# Patient Record
Sex: Female | Born: 1980 | Race: White | Hispanic: No | Marital: Married | Smoking: Never smoker
Health system: Southern US, Community
[De-identification: ages and names within clinical notes are randomized; demographics above are authoritative.]

## PROBLEM LIST (undated history)

## (undated) DIAGNOSIS — O09529 Supervision of elderly multigravida, unspecified trimester: Secondary | ICD-10-CM

## (undated) DIAGNOSIS — N979 Female infertility, unspecified: Secondary | ICD-10-CM

## (undated) DIAGNOSIS — F909 Attention-deficit hyperactivity disorder, unspecified type: Secondary | ICD-10-CM

## (undated) DIAGNOSIS — R011 Cardiac murmur, unspecified: Secondary | ICD-10-CM

## (undated) DIAGNOSIS — Z8742 Personal history of other diseases of the female genital tract: Secondary | ICD-10-CM

## (undated) DIAGNOSIS — E8881 Metabolic syndrome: Secondary | ICD-10-CM

## (undated) DIAGNOSIS — R51 Headache: Secondary | ICD-10-CM

## (undated) DIAGNOSIS — E88819 Insulin resistance, unspecified: Secondary | ICD-10-CM

## (undated) DIAGNOSIS — D649 Anemia, unspecified: Secondary | ICD-10-CM

## (undated) HISTORY — PX: WISDOM TOOTH EXTRACTION: SHX21

## (undated) HISTORY — PX: HYSTEROSCOPY: SHX211

## (undated) HISTORY — DX: Headache: R51

## (undated) HISTORY — PX: LAPAROSCOPY: SHX197

## (undated) HISTORY — DX: Cardiac murmur, unspecified: R01.1

## (undated) HISTORY — PX: OTHER SURGICAL HISTORY: SHX169

## (undated) HISTORY — DX: Personal history of other diseases of the female genital tract: Z87.42

## (undated) HISTORY — DX: Insulin resistance, unspecified: E88.819

## (undated) HISTORY — DX: Anemia, unspecified: D64.9

## (undated) HISTORY — DX: Female infertility, unspecified: N97.9

## (undated) HISTORY — DX: Metabolic syndrome: E88.81

## (undated) HISTORY — PX: BREAST SURGERY: SHX581

## (undated) HISTORY — DX: Supervision of elderly multigravida, unspecified trimester: O09.529

## (undated) HISTORY — PX: ARTHROSCOPIC REPAIR ACL: SUR80

## (undated) HISTORY — DX: Attention-deficit hyperactivity disorder, unspecified type: F90.9

---
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
## 2012-03-12 ENCOUNTER — Other Ambulatory Visit (HOSPITAL_COMMUNITY): Payer: Self-pay | Admitting: Obstetrics and Gynecology

## 2012-03-12 DIAGNOSIS — O3680X0 Pregnancy with inconclusive fetal viability, not applicable or unspecified: Secondary | ICD-10-CM

## 2012-03-14 ENCOUNTER — Ambulatory Visit (HOSPITAL_COMMUNITY)
Admission: RE | Admit: 2012-03-14 | Discharge: 2012-03-14 | Disposition: A | Discharge status: Home / Self Care | Payer: BC Managed Care – PPO | Source: Ambulatory Visit | Attending: Obstetrics and Gynecology | Admitting: Obstetrics and Gynecology

## 2012-03-14 DIAGNOSIS — O209 Hemorrhage in early pregnancy, unspecified: Secondary | ICD-10-CM | POA: Insufficient documentation

## 2012-03-14 DIAGNOSIS — O3680X0 Pregnancy with inconclusive fetal viability, not applicable or unspecified: Secondary | ICD-10-CM

## 2012-04-16 LAB — OB RESULTS CONSOLE HEPATITIS B SURFACE ANTIGEN: Hepatitis B Surface Ag: NEGATIVE

## 2012-04-16 LAB — OB RESULTS CONSOLE ABO/RH: RH Type: POSITIVE

## 2012-07-09 NOTE — L&D Delivery Note (Signed)
Delivery Note At 8:24 AM a viable female was delivered via Vaginal, Spontaneous Delivery (Presentation: Right Occiput Anterior).  APGAR: 5, 9; weight pending.   Placenta status: Intact, Spontaneous.  Cord: 3 vessels with the following complications: None.  Anesthesia: Epidural  Episiotomy: None Lacerations: 2nd degree Suture Repair: 3.0 vicryl rapide Est. Blood Loss (mL): 400  Mom to postpartum.  Baby to stay with mom.  Mikhi Athey D 11/07/2012, 8:49 AM

## 2012-10-22 ENCOUNTER — Inpatient Hospital Stay (HOSPITAL_COMMUNITY): Admission: AD | Admit: 2012-10-22 | Payer: Self-pay | Source: Ambulatory Visit | Admitting: Obstetrics and Gynecology

## 2012-10-29 ENCOUNTER — Telehealth (HOSPITAL_COMMUNITY): Payer: Self-pay | Admitting: *Deleted

## 2012-10-29 ENCOUNTER — Encounter (HOSPITAL_COMMUNITY): Payer: Self-pay | Admitting: *Deleted

## 2012-10-29 NOTE — Telephone Encounter (Signed)
Preadmission screen  

## 2012-11-06 ENCOUNTER — Inpatient Hospital Stay (HOSPITAL_COMMUNITY)
Admission: RE | Admit: 2012-11-06 | Discharge: 2012-11-09 | DRG: 373 | Disposition: A | Discharge status: Home / Self Care | Payer: BC Managed Care – PPO | Source: Ambulatory Visit | Attending: Obstetrics and Gynecology | Admitting: Obstetrics and Gynecology

## 2012-11-06 ENCOUNTER — Encounter (HOSPITAL_COMMUNITY): Payer: Self-pay

## 2012-11-06 DIAGNOSIS — O99892 Other specified diseases and conditions complicating childbirth: Secondary | ICD-10-CM | POA: Diagnosis present

## 2012-11-06 DIAGNOSIS — Z3490 Encounter for supervision of normal pregnancy, unspecified, unspecified trimester: Secondary | ICD-10-CM

## 2012-11-06 DIAGNOSIS — Z22330 Carrier of Group B streptococcus: Secondary | ICD-10-CM

## 2012-11-06 LAB — CBC
Hemoglobin: 12.8 g/dL (ref 12.0–15.0)
MCH: 28.8 pg (ref 26.0–34.0)
MCHC: 33.6 g/dL (ref 30.0–36.0)
RDW: 15.5 % (ref 11.5–15.5)

## 2012-11-06 MED ORDER — BUTORPHANOL TARTRATE 1 MG/ML IJ SOLN: 1.0000 mg | INTRAMUSCULAR | Status: DC | PRN

## 2012-11-06 MED ORDER — LACTATED RINGERS IV SOLN
INTRAVENOUS | Status: DC
  Administered 2012-11-06 – 2012-11-07 (×4): via INTRAVENOUS

## 2012-11-06 MED ORDER — PENICILLIN G POTASSIUM 5000000 UNITS IJ SOLR
5.0000 10*6.[IU] | Freq: Once | INTRAVENOUS | Status: DC
  Filled 2012-11-06: qty 5

## 2012-11-06 MED ORDER — OXYCODONE-ACETAMINOPHEN 5-325 MG PO TABS: 1.0000 | ORAL_TABLET | ORAL | Status: DC | PRN

## 2012-11-06 MED ORDER — MISOPROSTOL 25 MCG QUARTER TABLET
25.0000 ug | ORAL_TABLET | ORAL | Status: DC | PRN
  Administered 2012-11-06 – 2012-11-07 (×2): 25 ug via VAGINAL
  Filled 2012-11-06 (×2): qty 0.25
  Filled 2012-11-06: qty 1

## 2012-11-06 MED ORDER — LIDOCAINE HCL (PF) 1 % IJ SOLN
30.0000 mL | INTRAMUSCULAR | Status: DC | PRN
  Filled 2012-11-06 (×2): qty 30

## 2012-11-06 MED ORDER — OXYTOCIN 40 UNITS IN LACTATED RINGERS INFUSION - SIMPLE MED
62.5000 mL/h | INTRAVENOUS | Status: DC
  Filled 2012-11-06: qty 1000

## 2012-11-06 MED ORDER — TERBUTALINE SULFATE 1 MG/ML IJ SOLN: 0.2500 mg | Freq: Once | INTRAMUSCULAR | Status: AC | PRN

## 2012-11-06 MED ORDER — LACTATED RINGERS IV SOLN: 500.0000 mL | INTRAVENOUS | Status: DC | PRN

## 2012-11-06 MED ORDER — PENICILLIN G POTASSIUM 5000000 UNITS IJ SOLR
2.5000 10*6.[IU] | INTRAMUSCULAR | Status: DC
  Filled 2012-11-06 (×2): qty 2.5

## 2012-11-06 MED ORDER — ACETAMINOPHEN 325 MG PO TABS: 650.0000 mg | ORAL_TABLET | ORAL | Status: DC | PRN

## 2012-11-06 MED ORDER — CITRIC ACID-SODIUM CITRATE 334-500 MG/5ML PO SOLN: 30.0000 mL | ORAL | Status: DC | PRN

## 2012-11-06 MED ORDER — ONDANSETRON HCL 4 MG/2ML IJ SOLN
4.0000 mg | Freq: Four times a day (QID) | INTRAMUSCULAR | Status: DC | PRN
  Administered 2012-11-07: 4 mg via INTRAVENOUS
  Filled 2012-11-06: qty 2

## 2012-11-06 MED ORDER — OXYTOCIN BOLUS FROM INFUSION
500.0000 mL | INTRAVENOUS | Status: DC
  Administered 2012-11-07: 500 mL via INTRAVENOUS

## 2012-11-06 MED ORDER — IBUPROFEN 600 MG PO TABS: 600.0000 mg | ORAL_TABLET | Freq: Four times a day (QID) | ORAL | Status: DC | PRN

## 2012-11-07 ENCOUNTER — Inpatient Hospital Stay (HOSPITAL_COMMUNITY): Payer: BC Managed Care – PPO | Admitting: Anesthesiology

## 2012-11-07 ENCOUNTER — Encounter (HOSPITAL_COMMUNITY): Payer: Self-pay | Admitting: Anesthesiology

## 2012-11-07 ENCOUNTER — Encounter (HOSPITAL_COMMUNITY): Payer: Self-pay

## 2012-11-07 DIAGNOSIS — Z3490 Encounter for supervision of normal pregnancy, unspecified, unspecified trimester: Secondary | ICD-10-CM

## 2012-11-07 MED ORDER — PHENYLEPHRINE 40 MCG/ML (10ML) SYRINGE FOR IV PUSH (FOR BLOOD PRESSURE SUPPORT)
80.0000 ug | PREFILLED_SYRINGE | INTRAVENOUS | Status: DC | PRN
  Filled 2012-11-07: qty 2
  Filled 2012-11-07: qty 5

## 2012-11-07 MED ORDER — MEASLES, MUMPS & RUBELLA VAC ~~LOC~~ INJ
0.5000 mL | INJECTION | Freq: Once | SUBCUTANEOUS | Status: DC
  Filled 2012-11-07: qty 0.5

## 2012-11-07 MED ORDER — LANOLIN HYDROUS EX OINT: TOPICAL_OINTMENT | CUTANEOUS | Status: DC | PRN

## 2012-11-07 MED ORDER — SIMETHICONE 80 MG PO CHEW: 80.0000 mg | CHEWABLE_TABLET | ORAL | Status: DC | PRN

## 2012-11-07 MED ORDER — METHYLERGONOVINE MALEATE 0.2 MG/ML IJ SOLN: 0.2000 mg | INTRAMUSCULAR | Status: DC | PRN

## 2012-11-07 MED ORDER — SENNOSIDES-DOCUSATE SODIUM 8.6-50 MG PO TABS
2.0000 | ORAL_TABLET | Freq: Every day | ORAL | Status: DC
  Administered 2012-11-07: 1 via ORAL

## 2012-11-07 MED ORDER — DIPHENHYDRAMINE HCL 50 MG/ML IJ SOLN: 12.5000 mg | INTRAMUSCULAR | Status: DC | PRN

## 2012-11-07 MED ORDER — BENZOCAINE-MENTHOL 20-0.5 % EX AERO
1.0000 "application " | INHALATION_SPRAY | CUTANEOUS | Status: DC | PRN
  Administered 2012-11-07: 1 via TOPICAL
  Filled 2012-11-07: qty 56

## 2012-11-07 MED ORDER — PENICILLIN G POTASSIUM 5000000 UNITS IJ SOLR
5.0000 10*6.[IU] | Freq: Once | INTRAVENOUS | Status: AC
  Administered 2012-11-07: 5 10*6.[IU] via INTRAVENOUS
  Filled 2012-11-07: qty 5

## 2012-11-07 MED ORDER — ZOLPIDEM TARTRATE 5 MG PO TABS: 5.0000 mg | ORAL_TABLET | Freq: Every evening | ORAL | Status: DC | PRN

## 2012-11-07 MED ORDER — FENTANYL 2.5 MCG/ML BUPIVACAINE 1/10 % EPIDURAL INFUSION (WH - ANES)
14.0000 mL/h | INTRAMUSCULAR | Status: DC | PRN
  Filled 2012-11-07: qty 125

## 2012-11-07 MED ORDER — ONDANSETRON HCL 4 MG/2ML IJ SOLN: 4.0000 mg | INTRAMUSCULAR | Status: DC | PRN

## 2012-11-07 MED ORDER — FENTANYL 2.5 MCG/ML BUPIVACAINE 1/10 % EPIDURAL INFUSION (WH - ANES)
INTRAMUSCULAR | Status: DC | PRN
  Administered 2012-11-07: 14 mL/h via EPIDURAL

## 2012-11-07 MED ORDER — ONDANSETRON HCL 4 MG PO TABS: 4.0000 mg | ORAL_TABLET | ORAL | Status: DC | PRN

## 2012-11-07 MED ORDER — EPHEDRINE 5 MG/ML INJ
10.0000 mg | INTRAVENOUS | Status: DC | PRN
  Filled 2012-11-07: qty 2

## 2012-11-07 MED ORDER — IBUPROFEN 600 MG PO TABS: 600.0000 mg | ORAL_TABLET | Freq: Four times a day (QID) | ORAL | Status: DC

## 2012-11-07 MED ORDER — WITCH HAZEL-GLYCERIN EX PADS
1.0000 "application " | MEDICATED_PAD | CUTANEOUS | Status: DC | PRN
  Administered 2012-11-07: 1 via TOPICAL

## 2012-11-07 MED ORDER — ACETAMINOPHEN 500 MG PO TABS
1000.0000 mg | ORAL_TABLET | Freq: Four times a day (QID) | ORAL | Status: DC | PRN
  Administered 2012-11-07 – 2012-11-09 (×5): 1000 mg via ORAL
  Filled 2012-11-07 (×6): qty 2

## 2012-11-07 MED ORDER — EPHEDRINE 5 MG/ML INJ
10.0000 mg | INTRAVENOUS | Status: DC | PRN
  Filled 2012-11-07: qty 4
  Filled 2012-11-07: qty 2

## 2012-11-07 MED ORDER — MAGNESIUM HYDROXIDE 400 MG/5ML PO SUSP: 30.0000 mL | ORAL | Status: DC | PRN

## 2012-11-07 MED ORDER — OXYCODONE-ACETAMINOPHEN 5-325 MG PO TABS: 1.0000 | ORAL_TABLET | ORAL | Status: DC | PRN

## 2012-11-07 MED ORDER — DIBUCAINE 1 % RE OINT
1.0000 "application " | TOPICAL_OINTMENT | RECTAL | Status: DC | PRN
  Administered 2012-11-07: 1 via RECTAL
  Filled 2012-11-07: qty 28

## 2012-11-07 MED ORDER — PENICILLIN G POTASSIUM 5000000 UNITS IJ SOLR
2.5000 10*6.[IU] | INTRAVENOUS | Status: DC
  Administered 2012-11-07: 2.5 10*6.[IU] via INTRAVENOUS
  Filled 2012-11-07 (×3): qty 2.5

## 2012-11-07 MED ORDER — TETANUS-DIPHTH-ACELL PERTUSSIS 5-2.5-18.5 LF-MCG/0.5 IM SUSP: 0.5000 mL | Freq: Once | INTRAMUSCULAR | Status: DC

## 2012-11-07 MED ORDER — METHYLERGONOVINE MALEATE 0.2 MG PO TABS: 0.2000 mg | ORAL_TABLET | ORAL | Status: DC | PRN

## 2012-11-07 MED ORDER — LACTATED RINGERS IV SOLN
500.0000 mL | Freq: Once | INTRAVENOUS | Status: AC
  Administered 2012-11-07: 1000 mL via INTRAVENOUS

## 2012-11-07 MED ORDER — PHENYLEPHRINE 40 MCG/ML (10ML) SYRINGE FOR IV PUSH (FOR BLOOD PRESSURE SUPPORT)
80.0000 ug | PREFILLED_SYRINGE | INTRAVENOUS | Status: DC | PRN
  Filled 2012-11-07: qty 2

## 2012-11-07 MED ORDER — LIDOCAINE HCL (PF) 1 % IJ SOLN
INTRAMUSCULAR | Status: DC | PRN
  Administered 2012-11-07 (×2): 4 mL

## 2012-11-07 MED ORDER — PRENATAL MULTIVITAMIN CH
1.0000 | ORAL_TABLET | Freq: Every day | ORAL | Status: DC
  Administered 2012-11-08: 1 via ORAL
  Filled 2012-11-07: qty 1

## 2012-11-07 MED ORDER — DIPHENHYDRAMINE HCL 25 MG PO CAPS: 25.0000 mg | ORAL_CAPSULE | Freq: Four times a day (QID) | ORAL | Status: DC | PRN

## 2012-11-07 NOTE — Anesthesia Preprocedure Evaluation (Signed)
Anesthesia Evaluation  Patient identified by MRN, date of birth, ID band Patient awake    Reviewed: Allergy & Precautions, H&P , Patient's Chart, lab work & pertinent test results  Airway Mallampati: III TM Distance: >3 FB Neck ROM: full    Dental no notable dental hx. (+) Teeth Intact   Pulmonary neg pulmonary ROS,  breath sounds clear to auscultation  Pulmonary exam normal       Cardiovascular negative cardio ROS  + Valvular Problems/Murmurs Rhythm:regular Rate:Normal     Neuro/Psych  Headaches, PSYCHIATRIC DISORDERS Anxiety ADHD    GI/Hepatic negative GI ROS, Neg liver ROS,   Endo/Other  negative endocrine ROS  Renal/GU negative Renal ROS  negative genitourinary   Musculoskeletal negative musculoskeletal ROS (+)   Abdominal Normal abdominal exam  (+)   Peds  Hematology  (+) anemia ,   Anesthesia Other Findings   Reproductive/Obstetrics (+) Pregnancy                           Anesthesia Physical Anesthesia Plan  ASA: II  Anesthesia Plan: Epidural   Post-op Pain Management:    Induction:   Airway Management Planned:   Additional Equipment:   Intra-op Plan:   Post-operative Plan:   Informed Consent: I have reviewed the patients History and Physical, chart, labs and discussed the procedure including the risks, benefits and alternatives for the proposed anesthesia with the patient or authorized representative who has indicated his/her understanding and acceptance.     Plan Discussed with: Anesthesiologist  Anesthesia Plan Comments:         Anesthesia Quick Evaluation

## 2012-11-07 NOTE — Anesthesia Procedure Notes (Signed)
Epidural Patient location during procedure: OB Start time: 11/07/2012 2:59 AM  Staffing Anesthesiologist: Amily Depp A. Performed by: anesthesiologist   Preanesthetic Checklist Completed: patient identified, site marked, surgical consent, pre-op evaluation, timeout performed, IV checked, risks and benefits discussed and monitors and equipment checked  Epidural Patient position: sitting Prep: site prepped and draped and DuraPrep Patient monitoring: continuous pulse ox and blood pressure Approach: midline Injection technique: LOR air  Needle:  Needle type: Tuohy  Needle gauge: 17 G Needle length: 9 cm and 9 Needle insertion depth: 5 cm cm Catheter type: closed end flexible Catheter size: 19 Gauge Catheter at skin depth: 10 cm Test dose: negative and Other  Assessment Events: blood not aspirated, injection not painful, no injection resistance, negative IV test and no paresthesia  Additional Notes Patient identified. Risks and benefits discussed including failed block, incomplete  Pain control, post dural puncture headache, nerve damage, paralysis, blood pressure Changes, nausea, vomiting, reactions to medications-both toxic and allergic and post Partum back pain. All questions were answered. Patient expressed understanding and wished to proceed. Sterile technique was used throughout procedure. Epidural site was Dressed with sterile barrier dressing. No paresthesias, signs of intravascular injection Or signs of intrathecal spread were encountered.  Patient was more comfortable after the epidural was dosed. Please see RN's note for documentation of vital signs and FHR which are stable.

## 2012-11-07 NOTE — H&P (Signed)
Marissa Alexander is a 32 y.o. female, G1P0, EGA 40+ weeks with EDC 4-28 presenting for ripening and induction.  Prenatal care essentially uncomplicated, see prenatal records for complete history.  Maternal Medical History:  Fetal activity: Perceived fetal activity is normal.    Prenatal complications: no prenatal complications   OB History   Grav Para Term Preterm Abortions TAB SAB Ect Mult Living   1              Past Medical History  Diagnosis Date  . Heart murmur   . Female infertility   . Insulin resistance   . Headache   . ADHD (attention deficit hyperactivity disorder)   . Anemia   . Hx of endometritis    Past Surgical History  Procedure Laterality Date  . Arthroscopic repair acl    . Athroscopy of knee      right  . Hysteroscopy    . Laparoscopy    . Wisdom tooth extraction    . Breast surgery      lumpectomy R breast benign   Family History: family history includes Cancer in her maternal aunt, maternal uncle, paternal grandfather, and paternal grandmother; DES usage in her paternal grandmother; Diabetes in her paternal grandmother; Hypertension in her mother and paternal grandmother; Miscarriages / [place] in her paternal grandmother; Osteopenia in her mother; Osteoporosis in her paternal grandmother; and Rheum arthritis in her paternal grandmother. Social History:  reports that she has never smoked. She has never used smokeless tobacco. She reports that she does not drink alcohol or use illicit drugs.   Prenatal Transfer Tool  Maternal Diabetes: No Genetic Screening: Normal Maternal Ultrasounds/Referrals: Normal Fetal Ultrasounds or other Referrals:  None Maternal Substance Abuse:  No Significant Maternal Medications:  None Significant Maternal Lab Results:  Lab values include: Group B Strep positive Other Comments:  None  Review of Systems  Respiratory: Negative.   Cardiovascular: Negative.     Dilation: 10 Effacement (%): 100 Station: +1 Exam  by:: AL Rinehart JRN Blood pressure 122/64, pulse 69, temperature 98.8 F (37.1 C), temperature source Axillary, resp. rate 18, height 5\' 6"  (1.676 m), weight 72.576 kg (160 lb), last menstrual period 01/31/2012, SpO2 100.00%. Maternal Exam:  Abdomen: Patient reports no abdominal tenderness. Estimated fetal weight is 7 1/2 lbs.   Fetal presentation: vertex  Introitus: Normal vulva. Normal vagina.  Pelvis: adequate for delivery.   Cervix: Cervix evaluated by digital exam.     Fetal Exam Fetal Monitor Review: Mode: ultrasound.   Baseline rate: 140.  Variability: moderate (6-25 bpm).   Pattern: variable decelerations.    Fetal State Assessment: Category II - tracings are indeterminate.     Physical Exam  Constitutional: She appears well-developed and well-nourished.  Cardiovascular: Normal rate, regular rhythm and normal heart sounds.   No murmur heard. Respiratory: Effort normal and breath sounds normal. No respiratory distress.  GI: Soft.  Gravid     Prenatal labs: ABO, Rh: O/Positive/-- (10/09 0000) Antibody: Negative (10/09 0000) Rubella: Immune (10/09 0000) RPR: NON REACTIVE (05/01 2025)  HBsAg: Negative (10/09 0000)  HIV: Non-reactive (10/09 0000)  GBS: Positive (04/23 0000)  GCT:  111  Assessment/Plan: IUP at 40+ weeks admitted last pm for ripening and induction with unfavorable cervix.  She received cytotec, went into labor, has had SROM and is completely dilated and pushing.  Dr. Ambrose Mantle had ordered her PCN delayed for GBS, but she has now received 2 doses.  Will anticipate SVD.   Marissa Alexander D 11/07/2012,  8:02 AM

## 2012-11-07 NOTE — Anesthesia Postprocedure Evaluation (Signed)
  Anesthesia Post-op Note  Patient: Marissa Alexander  Procedure(s) Performed: * No procedures listed *  Patient Location: Mother/Baby  Anesthesia Type:Epidural  Level of Consciousness: awake  Airway and Oxygen Therapy: Patient Spontanous Breathing  Post-op Pain: mild  Post-op Assessment: Patient's Cardiovascular Status Stable and Respiratory Function Stable  Post-op Vital Signs: stable  Complications: No apparent anesthesia complications

## 2012-11-07 NOTE — Progress Notes (Signed)
Patient ID: Marissa Alexander, female   DOB: [birth year], 32 y.o.   MRN: [id number] I was called with the report that the pt had been found to be fully dilated and there was fetal bradycardia. I came immediately and when I arrived the FHR was normal. The pt received her first dose of penicillin at 3:59 AM Since the FHR is stable I will asllow the pt to labor without pushing and try to get the 4 hours of antibiotics prior to delivery.

## 2012-11-08 NOTE — Progress Notes (Signed)
PPD #1 No problems Afeb, VSS Fundus firm, NT at U-1 Continue routine postpartum care 

## 2012-11-09 NOTE — Progress Notes (Signed)
PPD #2 No problems Afeb, VSS D/c home 

## 2012-11-09 NOTE — Discharge Summary (Signed)
Obstetric Discharge Summary Reason for Admission: induction of labor Prenatal Procedures: none Intrapartum Procedures: spontaneous vaginal delivery Postpartum Procedures: none Complications-Operative and Postpartum: 2nd degree perineal laceration Hemoglobin  Date Value Range Status  11/06/2012 12.8  12.0 - 15.0 g/dL Final     HCT  Date Value Range Status  11/06/2012 38.1  36.0 - 46.0 % Final    Physical Exam:  General: alert Lochia: appropriate Uterine Fundus: firm  Discharge Diagnoses: Term Pregnancy-delivered  Discharge Information: Date: 11/09/2012 Activity: pelvic rest Diet: routine Medications: None Condition: stable Instructions: refer to practice specific booklet Discharge to: home Follow-up Information   Follow up with Brandie Lopes D, MD. Schedule an appointment as soon as possible for a visit in 6 weeks.   Contact information:   [address], [address] [place] [address] [phone]       Newborn Data: Live born female  Birth Weight: 8 lb 5.9 oz (3796 g) APGAR: 5, 9  Home with mother.  Hovanes Hymas D 11/09/2012, 10:05 AM

## 2014-05-10 ENCOUNTER — Encounter (HOSPITAL_COMMUNITY): Payer: Self-pay

## 2017-04-01 LAB — OB RESULTS CONSOLE GC/CHLAMYDIA
CHLAMYDIA, DNA PROBE: NEGATIVE
Gonorrhea: NEGATIVE

## 2017-04-01 LAB — OB RESULTS CONSOLE HIV ANTIBODY (ROUTINE TESTING): HIV: NONREACTIVE

## 2017-04-01 LAB — OB RESULTS CONSOLE RPR: RPR: NONREACTIVE

## 2017-04-01 LAB — OB RESULTS CONSOLE RUBELLA ANTIBODY, IGM: RUBELLA: IMMUNE

## 2017-04-01 LAB — OB RESULTS CONSOLE ANTIBODY SCREEN: ANTIBODY SCREEN: NEGATIVE

## 2017-04-01 LAB — OB RESULTS CONSOLE HEPATITIS B SURFACE ANTIGEN: HEP B S AG: NEGATIVE

## 2017-04-01 LAB — OB RESULTS CONSOLE ABO/RH: RH Type: POSITIVE

## 2017-07-09 NOTE — L&D Delivery Note (Signed)
Delivery Note She progressed to complete and pushed well.  At 3:24 PM a viable female was delivered via Vaginal, Spontaneous (Presentation: vtx; ROA ).  APGAR: 8, 9; weight  pending.   Placenta status: spontaneous, intact.  Cord:  with the following complications: none.   Anesthesia:  Epidural Episiotomy: None Lacerations: 2nd degree;Perineal Suture Repair: 3.0 vicryl rapide Est. Blood Loss (mL): 200  Mom to postpartum.  Baby to Couplet care / Skin to Skin.  Leighton Roachodd D Erhardt Dada 10/28/2017, 3:45 PM

## 2017-09-24 LAB — OB RESULTS CONSOLE GBS: GBS: POSITIVE

## 2017-10-14 ENCOUNTER — Encounter (HOSPITAL_COMMUNITY): Payer: Self-pay | Admitting: *Deleted

## 2017-10-14 ENCOUNTER — Telehealth (HOSPITAL_COMMUNITY): Payer: Self-pay | Admitting: *Deleted

## 2017-10-14 NOTE — Telephone Encounter (Signed)
Preadmission screen  

## 2017-10-24 ENCOUNTER — Inpatient Hospital Stay (HOSPITAL_COMMUNITY): Admission: RE | Admit: 2017-10-24 | Payer: Self-pay | Source: Ambulatory Visit

## 2017-10-28 ENCOUNTER — Encounter (HOSPITAL_COMMUNITY): Payer: Self-pay

## 2017-10-28 ENCOUNTER — Inpatient Hospital Stay (HOSPITAL_COMMUNITY)
Admission: RE | Admit: 2017-10-28 | Discharge: 2017-10-29 | DRG: 807 | Disposition: A | Discharge status: Home / Self Care | Payer: BLUE CROSS/BLUE SHIELD | Source: Ambulatory Visit | Attending: Obstetrics and Gynecology | Admitting: Obstetrics and Gynecology

## 2017-10-28 ENCOUNTER — Inpatient Hospital Stay (HOSPITAL_COMMUNITY): Payer: BLUE CROSS/BLUE SHIELD | Admitting: Anesthesiology

## 2017-10-28 DIAGNOSIS — O99824 Streptococcus B carrier state complicating childbirth: Principal | ICD-10-CM | POA: Diagnosis present

## 2017-10-28 DIAGNOSIS — Z3A39 39 weeks gestation of pregnancy: Secondary | ICD-10-CM | POA: Diagnosis not present

## 2017-10-28 DIAGNOSIS — O9902 Anemia complicating childbirth: Secondary | ICD-10-CM | POA: Diagnosis present

## 2017-10-28 DIAGNOSIS — O26893 Other specified pregnancy related conditions, third trimester: Secondary | ICD-10-CM | POA: Diagnosis present

## 2017-10-28 DIAGNOSIS — D649 Anemia, unspecified: Secondary | ICD-10-CM | POA: Diagnosis present

## 2017-10-28 LAB — CBC
HEMATOCRIT: 34 % — AB (ref 36.0–46.0)
HEMOGLOBIN: 11.1 g/dL — AB (ref 12.0–15.0)
MCH: 26.7 pg (ref 26.0–34.0)
MCHC: 32.6 g/dL (ref 30.0–36.0)
MCV: 81.7 fL (ref 78.0–100.0)
Platelets: 169 10*3/uL (ref 150–400)
RBC: 4.16 MIL/uL (ref 3.87–5.11)
RDW: 14.5 % (ref 11.5–15.5)
WBC: 9.2 10*3/uL (ref 4.0–10.5)

## 2017-10-28 LAB — TYPE AND SCREEN
ABO/RH(D): O POS
ANTIBODY SCREEN: NEGATIVE

## 2017-10-28 LAB — RPR: RPR: NONREACTIVE

## 2017-10-28 MED ORDER — MAGNESIUM HYDROXIDE 400 MG/5ML PO SUSP: 30.0000 mL | ORAL | Status: DC | PRN

## 2017-10-28 MED ORDER — PHENYLEPHRINE 40 MCG/ML (10ML) SYRINGE FOR IV PUSH (FOR BLOOD PRESSURE SUPPORT)
80.0000 ug | PREFILLED_SYRINGE | INTRAVENOUS | Status: DC | PRN
  Filled 2017-10-28: qty 5
  Filled 2017-10-28: qty 10

## 2017-10-28 MED ORDER — PRENATAL MULTIVITAMIN CH
1.0000 | ORAL_TABLET | Freq: Every day | ORAL | Status: DC
  Administered 2017-10-29: 1 via ORAL
  Filled 2017-10-28: qty 1

## 2017-10-28 MED ORDER — ACETAMINOPHEN 325 MG PO TABS: 650.0000 mg | ORAL_TABLET | ORAL | Status: DC | PRN

## 2017-10-28 MED ORDER — LACTATED RINGERS IV SOLN: 500.0000 mL | Freq: Once | INTRAVENOUS | Status: DC

## 2017-10-28 MED ORDER — ZOLPIDEM TARTRATE 5 MG PO TABS: 5.0000 mg | ORAL_TABLET | Freq: Every evening | ORAL | Status: DC | PRN

## 2017-10-28 MED ORDER — IBUPROFEN 600 MG PO TABS: 600.0000 mg | ORAL_TABLET | Freq: Four times a day (QID) | ORAL | Status: DC

## 2017-10-28 MED ORDER — BUTORPHANOL TARTRATE 1 MG/ML IJ SOLN: 1.0000 mg | INTRAMUSCULAR | Status: DC | PRN

## 2017-10-28 MED ORDER — OXYCODONE HCL 5 MG PO TABS: 5.0000 mg | ORAL_TABLET | ORAL | Status: DC | PRN

## 2017-10-28 MED ORDER — OXYCODONE HCL 5 MG PO TABS: 10.0000 mg | ORAL_TABLET | ORAL | Status: DC | PRN

## 2017-10-28 MED ORDER — OXYCODONE-ACETAMINOPHEN 5-325 MG PO TABS: 2.0000 | ORAL_TABLET | ORAL | Status: DC | PRN

## 2017-10-28 MED ORDER — BENZOCAINE-MENTHOL 20-0.5 % EX AERO
1.0000 "application " | INHALATION_SPRAY | CUTANEOUS | Status: DC | PRN
  Administered 2017-10-28: 1 via TOPICAL
  Filled 2017-10-28: qty 56

## 2017-10-28 MED ORDER — EPHEDRINE 5 MG/ML INJ
10.0000 mg | INTRAVENOUS | Status: DC | PRN
  Filled 2017-10-28: qty 2

## 2017-10-28 MED ORDER — LACTATED RINGERS IV SOLN
INTRAVENOUS | Status: DC
  Administered 2017-10-28 (×2): via INTRAVENOUS

## 2017-10-28 MED ORDER — ONDANSETRON HCL 4 MG PO TABS: 4.0000 mg | ORAL_TABLET | ORAL | Status: DC | PRN

## 2017-10-28 MED ORDER — PENICILLIN G POT IN DEXTROSE 60000 UNIT/ML IV SOLN
3.0000 10*6.[IU] | INTRAVENOUS | Status: DC
  Administered 2017-10-28: 3 10*6.[IU] via INTRAVENOUS
  Filled 2017-10-28 (×4): qty 50

## 2017-10-28 MED ORDER — SIMETHICONE 80 MG PO CHEW: 80.0000 mg | CHEWABLE_TABLET | ORAL | Status: DC | PRN

## 2017-10-28 MED ORDER — DIPHENHYDRAMINE HCL 50 MG/ML IJ SOLN: 12.5000 mg | INTRAMUSCULAR | Status: DC | PRN

## 2017-10-28 MED ORDER — SENNOSIDES-DOCUSATE SODIUM 8.6-50 MG PO TABS
2.0000 | ORAL_TABLET | ORAL | Status: DC
  Administered 2017-10-28: 2 via ORAL
  Filled 2017-10-28: qty 2

## 2017-10-28 MED ORDER — WITCH HAZEL-GLYCERIN EX PADS: 1.0000 "application " | MEDICATED_PAD | CUTANEOUS | Status: DC | PRN

## 2017-10-28 MED ORDER — TETANUS-DIPHTH-ACELL PERTUSSIS 5-2.5-18.5 LF-MCG/0.5 IM SUSP: 0.5000 mL | Freq: Once | INTRAMUSCULAR | Status: DC

## 2017-10-28 MED ORDER — PHENYLEPHRINE 40 MCG/ML (10ML) SYRINGE FOR IV PUSH (FOR BLOOD PRESSURE SUPPORT)
80.0000 ug | PREFILLED_SYRINGE | INTRAVENOUS | Status: DC | PRN
  Filled 2017-10-28: qty 5

## 2017-10-28 MED ORDER — OXYTOCIN 40 UNITS IN LACTATED RINGERS INFUSION - SIMPLE MED: 2.5000 [IU]/h | INTRAVENOUS | Status: DC

## 2017-10-28 MED ORDER — COCONUT OIL OIL: 1.0000 "application " | TOPICAL_OIL | Status: DC | PRN

## 2017-10-28 MED ORDER — ONDANSETRON HCL 4 MG/2ML IJ SOLN: 4.0000 mg | INTRAMUSCULAR | Status: DC | PRN

## 2017-10-28 MED ORDER — ONDANSETRON HCL 4 MG/2ML IJ SOLN: 4.0000 mg | Freq: Four times a day (QID) | INTRAMUSCULAR | Status: DC | PRN

## 2017-10-28 MED ORDER — SODIUM CHLORIDE 0.9 % IV SOLN
5.0000 10*6.[IU] | Freq: Once | INTRAVENOUS | Status: AC
  Administered 2017-10-28: 5 10*6.[IU] via INTRAVENOUS
  Filled 2017-10-28: qty 5

## 2017-10-28 MED ORDER — OXYCODONE-ACETAMINOPHEN 5-325 MG PO TABS: 1.0000 | ORAL_TABLET | ORAL | Status: DC | PRN

## 2017-10-28 MED ORDER — SOD CITRATE-CITRIC ACID 500-334 MG/5ML PO SOLN: 30.0000 mL | ORAL | Status: DC | PRN

## 2017-10-28 MED ORDER — OXYTOCIN BOLUS FROM INFUSION: 500.0000 mL | Freq: Once | INTRAVENOUS | Status: DC

## 2017-10-28 MED ORDER — LIDOCAINE HCL (PF) 1 % IJ SOLN
30.0000 mL | INTRAMUSCULAR | Status: DC | PRN
  Filled 2017-10-28: qty 30

## 2017-10-28 MED ORDER — METHYLERGONOVINE MALEATE 0.2 MG/ML IJ SOLN: 0.2000 mg | INTRAMUSCULAR | Status: DC | PRN

## 2017-10-28 MED ORDER — METHYLERGONOVINE MALEATE 0.2 MG PO TABS: 0.2000 mg | ORAL_TABLET | ORAL | Status: DC | PRN

## 2017-10-28 MED ORDER — LIDOCAINE HCL (PF) 1 % IJ SOLN
INTRAMUSCULAR | Status: DC | PRN
  Administered 2017-10-28 (×2): 4 mL via EPIDURAL

## 2017-10-28 MED ORDER — DIPHENHYDRAMINE HCL 25 MG PO CAPS: 25.0000 mg | ORAL_CAPSULE | Freq: Four times a day (QID) | ORAL | Status: DC | PRN

## 2017-10-28 MED ORDER — MEASLES, MUMPS & RUBELLA VAC ~~LOC~~ INJ: 0.5000 mL | INJECTION | Freq: Once | SUBCUTANEOUS | Status: DC

## 2017-10-28 MED ORDER — LACTATED RINGERS IV SOLN: 500.0000 mL | INTRAVENOUS | Status: DC | PRN

## 2017-10-28 MED ORDER — ACETAMINOPHEN 325 MG PO TABS
650.0000 mg | ORAL_TABLET | ORAL | Status: DC | PRN
  Administered 2017-10-28 – 2017-10-29 (×2): 650 mg via ORAL
  Filled 2017-10-28 (×2): qty 2

## 2017-10-28 MED ORDER — FENTANYL 2.5 MCG/ML BUPIVACAINE 1/10 % EPIDURAL INFUSION (WH - ANES)
14.0000 mL/h | INTRAMUSCULAR | Status: DC | PRN
  Administered 2017-10-28: 14 mL/h via EPIDURAL
  Filled 2017-10-28: qty 100

## 2017-10-28 MED ORDER — OXYTOCIN 40 UNITS IN LACTATED RINGERS INFUSION - SIMPLE MED
1.0000 m[IU]/min | INTRAVENOUS | Status: DC
  Administered 2017-10-28: 2 m[IU]/min via INTRAVENOUS
  Filled 2017-10-28: qty 1000

## 2017-10-28 MED ORDER — TERBUTALINE SULFATE 1 MG/ML IJ SOLN
0.2500 mg | Freq: Once | INTRAMUSCULAR | Status: DC | PRN
  Filled 2017-10-28: qty 1

## 2017-10-28 MED ORDER — DIBUCAINE 1 % RE OINT
1.0000 "application " | TOPICAL_OINTMENT | RECTAL | Status: DC | PRN
  Administered 2017-10-28: 1 via RECTAL
  Filled 2017-10-28: qty 28

## 2017-10-28 NOTE — Anesthesia Preprocedure Evaluation (Signed)
Anesthesia Evaluation  Patient identified by MRN, date of birth, ID band Patient awake    Reviewed: Allergy & Precautions, H&P , Patient's Chart, lab work & pertinent test results  Airway Mallampati: II  TM Distance: >3 FB Neck ROM: full    Dental no notable dental hx. (+) Teeth Intact   Pulmonary neg pulmonary ROS,    Pulmonary exam normal breath sounds clear to auscultation       Cardiovascular negative cardio ROS  + Valvular Problems/Murmurs  Rhythm:Regular Rate:Normal     Neuro/Psych  Headaches, PSYCHIATRIC DISORDERS Anxiety Hx/o ADHDADHD    GI/Hepatic negative GI ROS, Neg liver ROS,   Endo/Other  negative endocrine ROS  Renal/GU negative Renal ROS  negative genitourinary   Musculoskeletal negative musculoskeletal ROS (+)   Abdominal   Peds  Hematology  (+) anemia ,   Anesthesia Other Findings   Reproductive/Obstetrics (+) Pregnancy AMA                             Anesthesia Physical  Anesthesia Plan  ASA: II  Anesthesia Plan: Epidural   Post-op Pain Management:    Induction:   PONV Risk Score and Plan:   Airway Management Planned: Natural Airway  Additional Equipment:   Intra-op Plan:   Post-operative Plan:   Informed Consent: I have reviewed the patients History and Physical, chart, labs and discussed the procedure including the risks, benefits and alternatives for the proposed anesthesia with the patient or authorized representative who has indicated his/her understanding and acceptance.     Plan Discussed with: Anesthesiologist  Anesthesia Plan Comments:         Anesthesia Quick Evaluation

## 2017-10-28 NOTE — H&P (Signed)
Marissa Alexander is a 37 y.o. female, G3 P1011, EGA 39+ weeks with [facility] 4-23 presenting for elective induction.  Prenatal care complicated by AMA, u/s with CP cyst and EIF, panorama low risk.  OB History    Gravida  3   Para  1   Term  1   Preterm      AB  1   Living  1     SAB  1   TAB      Ectopic      Multiple      Live Births  1          Past Medical History:  Diagnosis Date  . ADHD (attention deficit hyperactivity disorder)   . AMA (advanced maternal age) multigravida 35+   . Anemia    as a teen  . Female infertility   . Headache(784.0)   . Heart murmur   . Hx of endometritis   . Insulin resistance    Past Surgical History:  Procedure Laterality Date  . ARTHROSCOPIC REPAIR ACL    . athroscopy of knee     right  . BREAST SURGERY     lumpectomy R breast benign  . HYSTEROSCOPY     12 CM endometrioma  . LAPAROSCOPY    . WISDOM TOOTH EXTRACTION     Family History: family history includes Cancer in her maternal aunt, maternal uncle, paternal grandfather, and paternal grandmother; DES usage in her paternal grandmother; Diabetes in her paternal grandmother; Hypertension in her mother and paternal grandmother; Miscarriages / [place] in her paternal grandmother; Osteopenia in her mother; Osteoporosis in her paternal grandmother; Rheum arthritis in her paternal grandmother. Social History:  reports that she has never smoked. She has never used smokeless tobacco. She reports that she does not drink alcohol or use drugs.     Maternal Diabetes: No Genetic Screening: Normal Maternal Ultrasounds/Referrals: Abnormal:  Findings:   Isolated EIF (echogenic intracardiac focus), Isolated choroid plexus cyst Fetal Ultrasounds or other Referrals:  None Maternal Substance Abuse:  No Significant Maternal Medications:  None Significant Maternal Lab Results:  Lab values include: Group B Strep positive Other Comments:  None  Review of Systems  Respiratory:  Negative.   Cardiovascular: Negative.    Maternal Medical History:  Contractions: Perceived severity is mild.    Fetal activity: Perceived fetal activity is normal.    Prenatal complications: no prenatal complications Prenatal Complications - Diabetes: none.    Dilation: 4 Effacement (%): 50 Station: -2 Exam by:: J.Cox, RN Blood pressure 112/79, pulse 69, height 5\' 7"  (1.702 m), weight 82.7 kg (182 lb 4.8 oz), last menstrual period 01/22/2017, unknown if currently breastfeeding. Maternal Exam:  Uterine Assessment: Contraction strength is mild.  Contraction frequency is irregular.   Abdomen: Patient reports no abdominal tenderness. Estimated fetal weight is 7 1/2 lbs.   Fetal presentation: vertex  Introitus: Normal vulva. Normal vagina.  Amniotic fluid character: not assessed.  Pelvis: adequate for delivery.   Cervix: Cervix evaluated by digital exam.     Fetal Exam Fetal Monitor Review: Mode: ultrasound.   Baseline rate: 120-130.  Variability: moderate (6-25 bpm).   Pattern: accelerations present and no decelerations.    Fetal State Assessment: Category I - tracings are normal.     Physical Exam  Vitals reviewed. Constitutional: She appears well-developed and well-nourished.  Cardiovascular: Normal rate and regular rhythm.  Respiratory: Effort normal. No respiratory distress.  GI: Soft.    Prenatal labs: ABO, Rh: O/Positive/-- (09/24 0000) Antibody:  Negative (09/24 0000) Rubella: Immune (09/24 0000) RPR: Nonreactive (09/24 0000)  HBsAg: Negative (09/24 0000)  HIV: Non-reactive (09/24 0000)  GBS: Positive (03/19 0000)   Assessment/Plan: IUP at 39+ weeks with favorable cervix, +GBS, AMA with CP cyst and EIF, Panorama low risk.  Will start pitocin, PCN for +GBS, monitor progress, anticipate SVD.   Leighton Roachodd D Khalise Billard 10/28/2017, 8:33 AM

## 2017-10-28 NOTE — Anesthesia Procedure Notes (Addendum)
Epidural Patient location during procedure: OB Start time: 10/28/2017 9:32 AM  Staffing Anesthesiologist: Mal AmabileFoster, Nikoloz Huy, MD Performed: anesthesiologist   Preanesthetic Checklist Completed: patient identified, site marked, surgical consent, pre-op evaluation, timeout performed, IV checked, risks and benefits discussed and monitors and equipment checked  Epidural Patient position: sitting Prep: site prepped and draped and DuraPrep Patient monitoring: continuous pulse ox and blood pressure Approach: midline Location: L3-L4 Injection technique: LOR air  Needle:  Needle type: Tuohy  Needle gauge: 17 G Needle length: 9 cm and 9 Needle insertion depth: 4 cm Catheter type: closed end flexible Catheter size: 19 Gauge Catheter at skin depth: 9 cm Test dose: negative and Other  Assessment Events: blood not aspirated, injection not painful, no injection resistance, negative IV test and no paresthesia  Additional Notes Patient identified. Risks and benefits discussed including failed block, incomplete  Pain control, post dural puncture headache, nerve damage, paralysis, blood pressure Changes, nausea, vomiting, reactions to medications-both toxic and allergic and post Partum back pain. All questions were answered. Patient expressed understanding and wished to proceed. Sterile technique was used throughout procedure. Epidural site was Dressed with sterile barrier dressing. No paresthesias, signs of intravascular injection Or signs of intrathecal spread were encountered.  Patient was more comfortable after the epidural was dosed. Please see RN's note for documentation of vital signs and FHR which are stable.

## 2017-10-28 NOTE — Anesthesia Pain Management Evaluation Note (Signed)
  CRNA Pain Management Visit Note  Patient: Marissa Alexander, 37 y.o., female  "Hello [facility]. We have an anesthesia team available at all times to provide care throughout the hospital, including epidural management and anesthesia for C-section. I don't know your plan for the delivery whether it a natural birth, water birth, IV sedation, nitrous supplementation, doula or epidural, but we want to meet your pain goals."   1.Was your pain managed to your expectations on prior hospitalizations?   Yes   2.What is your expectation for pain management during this hospitalization?     Epidural  3.How can we help you reach that goal? Epidural in place.  Record the patient's initial score and the patient's pain goal.   Pain: 0  Pain Goal: 4 prior to epidural placement.  [facility] wants you to be able to say your pain was always managed very well.  [name] L 10/28/2017

## 2017-10-28 NOTE — Anesthesia Postprocedure Evaluation (Signed)
Anesthesia Post Note  Patient: Marissa Alexander  Procedure(s) Performed: AN AD HOC LABOR EPIDURAL     Patient location during evaluation: Mother Baby Anesthesia Type: Epidural Level of consciousness: awake Pain management: satisfactory to patient Vital Signs Assessment: post-procedure vital signs reviewed and stable Respiratory status: spontaneous breathing Cardiovascular status: stable Anesthetic complications: no    Last Vitals:  Vitals:   10/28/17 1750 10/28/17 1840  BP: 118/83 113/69  Pulse: 84 84  Temp: 37.2 C 37 C  SpO2: 100% 99%    Last Pain:  Vitals:   10/28/17 1750  TempSrc: Oral   Pain Goal:                 KeyCorpBURGER,Lauria Depoy

## 2017-10-28 NOTE — Progress Notes (Signed)
Comfortable with epidural Afeb, VSS FHT- Cat I, ctx q 3-4 min VE-4-5/70/-1, vtx, AROM clear Continue pitocin and PCN, monitor progress and anticipate SVD

## 2017-10-29 ENCOUNTER — Inpatient Hospital Stay (HOSPITAL_COMMUNITY): Admission: AD | Admit: 2017-10-29 | Payer: Self-pay | Source: Ambulatory Visit | Admitting: Obstetrics and Gynecology

## 2017-10-29 MED ORDER — ACETAMINOPHEN 325 MG PO TABS: 650.0000 mg | ORAL_TABLET | Freq: Four times a day (QID) | ORAL | 1 refills | Status: AC | PRN

## 2017-10-29 NOTE — Lactation Note (Signed)
This note was copied from a baby's chart. Lactation Consultation Note Baby 13 hrs old BF w/o difficulty. Having long feedings. Mom denies painful latches. Mom Bf her now 624 1/38 yr old for 1 yr w/o difficulty.  Mom has everted nipples. Reviewed newborn feeding habits, cluster feeding, I&O, STS. Assess for transfer after feeding.  Answered mom and FOB questions. Encouraged to call for assistance or questions.  [facility] brochure given w/resources, support groups and LC services.  Patient Name: Marissa Luanna ColeJennifer Ozanich ZOXWR'UToday's Date: 10/29/2017 Reason for consult: Initial assessment   Maternal Data Has patient been taught Hand Expression?: Yes Does the patient have breastfeeding experience prior to this delivery?: Yes  Feeding Feeding Type: Breast Fed Length of feed: 30 min  LATCH Score Latch: Grasps breast easily, tongue down, lips flanged, rhythmical sucking.  Audible Swallowing: Spontaneous and intermittent  Type of Nipple: Everted at rest and after stimulation  Comfort (Breast/Nipple): Soft / non-tender  Hold (Positioning): No assistance needed to correctly position infant at breast.  LATCH Score: 10  Interventions Interventions: Breast feeding basics reviewed;Support pillows;Skin to skin;Breast massage  Lactation Tools Discussed/Used WIC Program: No   Consult Status Consult Status: Follow-up Date: 10/30/17 Follow-up type: In-patient    Charyl DancerCARVER, Cristol Engdahl G 10/29/2017, 4:51 AM

## 2017-10-29 NOTE — Progress Notes (Signed)
Patient ID: Marissa HaskellJennifer K Alexander, female   DOB: 09-Nov-1980, 37 y.o.   MRN: [id number] Pt doing well with no complaints. Pain well controlled with tylenol. Denies fever, chills, SOB or CP. Mild HA  Now but improving with breakfast. No blurry vision. Voiding well. Lochia mild. Bonding well with baby - breastfeeding VSS ABD - FF and 2cm below umb EXT - no homans  A/P: PPD#1 s/p svd - stable         Routine pp care         If baby ok for discharge then pt may be discharged too         D/C instructions reviewed; f/u in 6 weeks

## 2017-10-29 NOTE — Discharge Instructions (Signed)
Nothing in vagina for 6 weeks.  No sex, tampons, and douching.  Other instructions as in [facility] Discharge Booklet. °

## 2017-10-29 NOTE — Discharge Summary (Signed)
OB Discharge Summary     Patient Name: Marissa Alexander DOB: 1980/12/12 MRN: [id number]  Date of admission: 10/28/2017 Delivering MD: Jackelyn KnifeMEISINGER, TODD   Date of discharge: 10/29/2017  Admitting diagnosis: INDUCTION Intrauterine pregnancy: 1856w6d     Secondary diagnosis:  Active Problems:   Indication for care in labor or delivery  Additional problems: none     Discharge diagnosis: Term Pregnancy Delivered                                                                                                Post partum procedures:none  Augmentation: AROM and Pitocin  Complications: None  Hospital course:  Induction of Labor With Vaginal Delivery   37 y.o. yo [id number] at 4856w6d was admitted to the hospital 10/28/2017 for induction of labor.  Indication for induction: Favorable cervix at term.  Patient had an uncomplicated labor course as follows: Membrane Rupture Time/Date: 12:27 PM ,10/28/2017   Intrapartum Procedures: Episiotomy: None [1]                                         Lacerations:  2nd degree [3];Perineal [11]  Patient had delivery of a Viable infant.  Information for the patient's newborn:  Penny, Girl Victorino DikeJennifer [[id number]][[id number]]  Delivery Method: Vaginal, Spontaneous(Filed from Delivery Summary)   10/28/2017  Details of delivery can be found in separate delivery note.  Patient had a routine postpartum course. Patient is discharged home 10/29/17.  Physical exam  Vitals:   10/28/17 1750 10/28/17 1840 10/28/17 2240 10/29/17 0606  BP: 118/83 113/69 113/66 121/74  Pulse: 84 84 64 69  Resp:   16 18  Temp: 99 F (37.2 C) 98.6 F (37 C) 98.7 F (37.1 C) 98.5 F (36.9 C)  TempSrc: Oral  Oral Oral  SpO2: 100% 99% 99%   Weight:      Height:       General: alert, cooperative and no distress Lochia: appropriate Uterine Fundus: firm Incision: N/A DVT Evaluation: No evidence of DVT seen on physical exam. No significant calf/ankle edema. Labs: Lab Results  Component Value  Date   WBC 9.2 10/28/2017   HGB 11.1 (L) 10/28/2017   HCT 34.0 (L) 10/28/2017   MCV 81.7 10/28/2017   PLT 169 10/28/2017   No flowsheet data found.  Discharge instruction: per After Visit Summary and "Baby and Me Booklet".  After visit meds:  Allergies as of 10/29/2017      Reactions   Motrin [ibuprofen] Nausea Only   Other    Possible reaction to dissolvable sutures. Use chromic if needed.  Does OK with Vicryl Rapide      Medication List    TAKE these medications   acetaminophen 500 MG tablet Commonly known as:  TYLENOL Take 500 mg by mouth every 6 (six) hours as needed for mild pain or headache. What changed:  Another medication with the same name was added. Make sure you understand how and when to take each.   acetaminophen 325 MG tablet  Commonly known as:  TYLENOL Take 2 tablets (650 mg total) by mouth every 6 (six) hours as needed for moderate pain or headache (for pain scale < 4). What changed:  You were already taking a medication with the same name, and this prescription was added. Make sure you understand how and when to take each.   calcium carbonate 500 MG chewable tablet Commonly known as:  TUMS - dosed in mg elemental calcium Chew 1 tablet by mouth daily as needed for heartburn.   prenatal multivitamin Tabs tablet Take 1 tablet by mouth daily at 12 noon.   ranitidine 150 MG tablet Commonly known as:  ZANTAC Take 150 mg by mouth 2 (two) times daily as needed for heartburn.       Diet: routine diet  Activity: Advance as tolerated. Pelvic rest for 6 weeks.   Outpatient follow up:6 weeks Follow up Appt:No future appointments. Follow up Visit:No follow-ups on file.  Postpartum contraception: Not Discussed  Newborn Data: Live born female  Birth Weight: 8 lb 4.5 oz (3755 g) APGAR: 8, 9  Newborn Delivery   Birth date/time:  10/28/2017 15:24:00 Delivery type:  Vaginal, Spontaneous     Baby Feeding: Breast Disposition:home with  mother   10/29/2017 Cathrine Muster, DO

## 2017-10-29 NOTE — Progress Notes (Signed)
CSW received consult due to score 11 on the [place].   Parents were pleasant and welcoming of CSW's visit.  MOB was quiet, though receptive of talking about her feelings.  She introduced her visitors as her sister and 38 year old daughter and stated that we could talk openly with her family in the room. Parents report that they are exhausted, mainly, but otherwise doing well.  MOB acknowledges an increase in anxiety at the end of her pregnancy, but states that her understanding is that this is normal.  She spoke about what she coped with this and did not let it interfere with daily life.  She denies symptoms of PMADs after her first daughter and reports having great supports.   CSW provided education regarding Baby Blues vs PMADs and provided MOB with information about support groups [facility].  CSW encouraged MOB to evaluate her mental health throughout the postpartum period with the use of the New Mom Checklist developed by [facility] and notify a medical professional if symptoms arise.  MOB agrees and states no questions, concerns, or needs at this time.  FOB and sister appear very supportive.

## 2019-10-08 ENCOUNTER — Ambulatory Visit: Payer: Self-pay | Attending: Internal Medicine

## 2019-10-08 DIAGNOSIS — Z23 Encounter for immunization: Secondary | ICD-10-CM

## 2019-10-08 NOTE — Progress Notes (Signed)
   Covid-19 Vaccination Clinic  Name:  Marissa Alexander    MRN: [id number] DOB: 05-05-1981  10/08/2019  [name] was observed post Covid-19 immunization for 15 minutes without incident. She was provided with Vaccine Information Sheet and instruction to access the V-Safe system.   [name] was instructed to call 911 with any severe reactions post vaccine: Marland Kitchen Difficulty breathing  . Swelling of face and throat  . A fast heartbeat  . A bad rash all over body  . Dizziness and weakness   Immunizations Administered    Name Date Dose VIS Date Route   Pfizer COVID-19 Vaccine 10/08/2019  3:40 PM 0.3 mL 06/19/2019 Intramuscular   Manufacturer: [employer], [employer]   Lot: [id number]   NDC: [address]-5

## 2019-11-02 ENCOUNTER — Ambulatory Visit: Payer: Self-pay | Attending: Internal Medicine

## 2019-11-02 DIAGNOSIS — Z23 Encounter for immunization: Secondary | ICD-10-CM

## 2019-11-02 NOTE — Progress Notes (Signed)
   Covid-19 Vaccination Clinic  Name:  ISOLA MEHLMAN    MRN: [id number] DOB: 1980-11-03  11/02/2019  Ms. Harney was observed post Covid-19 immunization for 15 minutes without incident. She was provided with Vaccine Information Sheet and instruction to access the V-Safe system.   Ms. Rigdon was instructed to call 911 with any severe reactions post vaccine: Marland Kitchen Difficulty breathing  . Swelling of face and throat  . A fast heartbeat  . A bad rash all over body  . Dizziness and weakness   Immunizations Administered    Name Date Dose VIS Date Route   Pfizer COVID-19 Vaccine 11/02/2019  4:07 PM 0.3 mL 09/02/2018 Intramuscular   Manufacturer: [employer], [employer]   Lot: [id number]   NDC: [address]-5
